# Patient Record
Sex: Male | Born: 1995 | Race: Black or African American | Hispanic: No | Marital: Single | Smoking: Never smoker
Health system: Southern US, Community
[De-identification: ages and names within clinical notes are randomized; demographics above are authoritative.]

---

## 2014-04-01 ENCOUNTER — Emergency Department (HOSPITAL_COMMUNITY)
Admission: EM | Admit: 2014-04-01 | Discharge: 2014-04-02 | Disposition: A | Discharge status: Home / Self Care | Payer: No Typology Code available for payment source | Attending: Emergency Medicine | Admitting: Emergency Medicine

## 2014-04-01 ENCOUNTER — Emergency Department (HOSPITAL_COMMUNITY): Payer: No Typology Code available for payment source

## 2014-04-01 ENCOUNTER — Encounter (HOSPITAL_COMMUNITY): Payer: Self-pay | Admitting: Emergency Medicine

## 2014-04-01 DIAGNOSIS — S01111A Laceration without foreign body of right eyelid and periocular area, initial encounter: Secondary | ICD-10-CM

## 2014-04-01 DIAGNOSIS — S058X9A Other injuries of unspecified eye and orbit, initial encounter: Secondary | ICD-10-CM | POA: Insufficient documentation

## 2014-04-01 DIAGNOSIS — Y9389 Activity, other specified: Secondary | ICD-10-CM | POA: Insufficient documentation

## 2014-04-01 DIAGNOSIS — S0181XA Laceration without foreign body of other part of head, initial encounter: Secondary | ICD-10-CM

## 2014-04-01 DIAGNOSIS — S0180XA Unspecified open wound of other part of head, initial encounter: Secondary | ICD-10-CM | POA: Insufficient documentation

## 2014-04-01 DIAGNOSIS — S0510XA Contusion of eyeball and orbital tissues, unspecified eye, initial encounter: Secondary | ICD-10-CM | POA: Diagnosis not present

## 2014-04-01 DIAGNOSIS — S0990XA Unspecified injury of head, initial encounter: Secondary | ICD-10-CM | POA: Diagnosis present

## 2014-04-01 DIAGNOSIS — S63509A Unspecified sprain of unspecified wrist, initial encounter: Secondary | ICD-10-CM | POA: Diagnosis not present

## 2014-04-01 DIAGNOSIS — S0511XA Contusion of eyeball and orbital tissues, right eye, initial encounter: Secondary | ICD-10-CM

## 2014-04-01 DIAGNOSIS — Y92410 Unspecified street and highway as the place of occurrence of the external cause: Secondary | ICD-10-CM | POA: Insufficient documentation

## 2014-04-01 DIAGNOSIS — S63502A Unspecified sprain of left wrist, initial encounter: Secondary | ICD-10-CM

## 2014-04-01 MED ORDER — LIDOCAINE-EPINEPHRINE-TETRACAINE (LET) SOLUTION
3.0000 mL | Freq: Once | NASAL | Status: AC
  Administered 2014-04-01: 3 mL via TOPICAL
  Filled 2014-04-01: qty 3

## 2014-04-01 MED ORDER — PROPARACAINE HCL 0.5 % OP SOLN
1.0000 [drp] | Freq: Once | OPHTHALMIC | Status: AC
  Administered 2014-04-02: 1 [drp] via OPHTHALMIC
  Filled 2014-04-01: qty 15

## 2014-04-01 MED ORDER — FLUORESCEIN SODIUM 1 MG OP STRP
1.0000 | ORAL_STRIP | Freq: Once | OPHTHALMIC | Status: DC
  Filled 2014-04-01: qty 1

## 2014-04-01 MED ORDER — LIDOCAINE-EPINEPHRINE 1 %-1:100000 IJ SOLN
1.7000 mL | Freq: Once | INTRAMUSCULAR | Status: AC
  Administered 2014-04-02: 1.7 mL via INTRADERMAL
  Filled 2014-04-01: qty 1

## 2014-04-01 MED ORDER — OXYCODONE-ACETAMINOPHEN 5-325 MG PO TABS
2.0000 | ORAL_TABLET | Freq: Once | ORAL | Status: AC
  Administered 2014-04-01: 2 via ORAL
  Filled 2014-04-01: qty 2
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
## 2014-04-01 NOTE — ED Notes (Signed)
Pt to xray at this time.

## 2014-04-01 NOTE — ED Notes (Signed)
No visible injuiry to head. Pt positive for LOC. Pt able to see through right eye.

## 2014-04-01 NOTE — ED Notes (Signed)
OD  20/20 OD  20/20 OU  20/20

## 2014-04-02 MED ORDER — TRAMADOL HCL 50 MG PO TABS: 50.0000 mg | ORAL_TABLET | Freq: Four times a day (QID) | ORAL | Status: AC | PRN

## 2014-04-02 NOTE — Discharge Instructions (Signed)
° °  Bicycling, Adult Cyclists  Whether motivated by recreation or transportation, more adults are taking up cycling than ever before. Learning more about cycling greatly increases confidence. And it can be a great aid in learning to share the road more effectively.  If you are using your bicycle in different situations than you previously have, such as switching from occasional short recreational rides to regularly commuting to work, you may want to take a short workshop. Begin by assessing yourself: How confident are you in your cycling skills? What would you like to know more about? Are there particular kinds of cycling you would like to try out? Courses and workshops may focus on learning to race, long distance touring, teaching children to cycle safely, commuting, or bike repairs. With that in mind, adult cyclists may wish to check around their community for bike clubs, classes, rides, and other cycling opportunities. Check with the [employer] of [employer] Bicy[employer] (www.bikeleague.org) for a listing of instructional opportunities available in your area.  Brush up on riding skills and rules if it has been a while since you cycled regularly.  Adult cyclists who wish to cycle with small children, and cyclists needing to transport cargo, should investigate the various child seats and trailers available. Determine which are the safest and which will work best for you.  Adult cyclists should learn more about off-road cycling, touring, and racing before participating in these activities. Adult cyclists are encouraged to try cycling on multi-use paths. Remember to respect others' needs on the trails.  Do not underestimate the importance of wearing a helmet. Accidents can happen anywhere. Cyclists should always wear a helmet.  Adult cyclists should learn how to handle harassment from motorists and others in traffic. It is in your best interest not to return any harassment or insults.  Just like a car, a  bicycle requires basic maintenance to keep running smoothly and safely. Bikes are easy to work on and you can save money by learning bike maintenance. This can be done by picking up a manual and taking a repair course. Those who really do not have time should keep their bicycles regularly serviced at a good bike shop.  Bicycling can fit into one's everyday life. Substitute a bike ride for a car trip. Adult cyclists should know the health and environmental benefits of bicycling. Document Released: 10/18/2003 Document Revised: 12/12/2013 Document Reviewed: 07/24/2008 [facility] Patient Information 2015 [place], [place]. This information is not intended to replace advice given to you by your health care provider. Make sure you discuss any questions you have with your health care provider.
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
## 2015-02-21 IMAGING — CR DG WRIST COMPLETE 3+V*L*
4 series · 4 of 4 positions shown · non-contrast
Comparison: None.

CLINICAL DATA: Head injury.  Diffuse wrist pain.

EXAM:
LEFT WRIST - COMPLETE 3+ VIEW

[x wrist pa left]
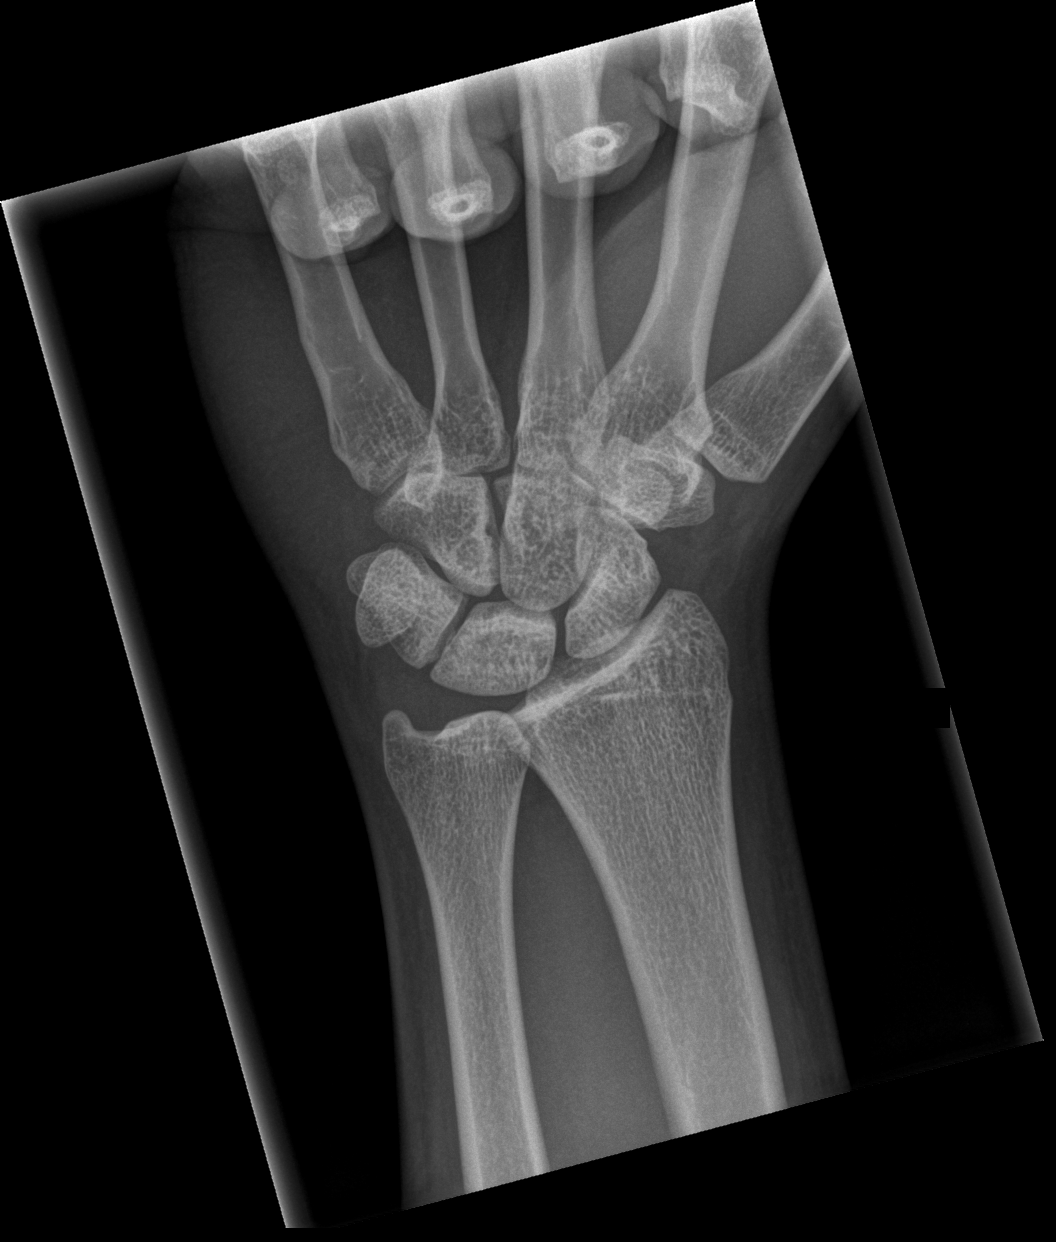

[x wrist obl left]
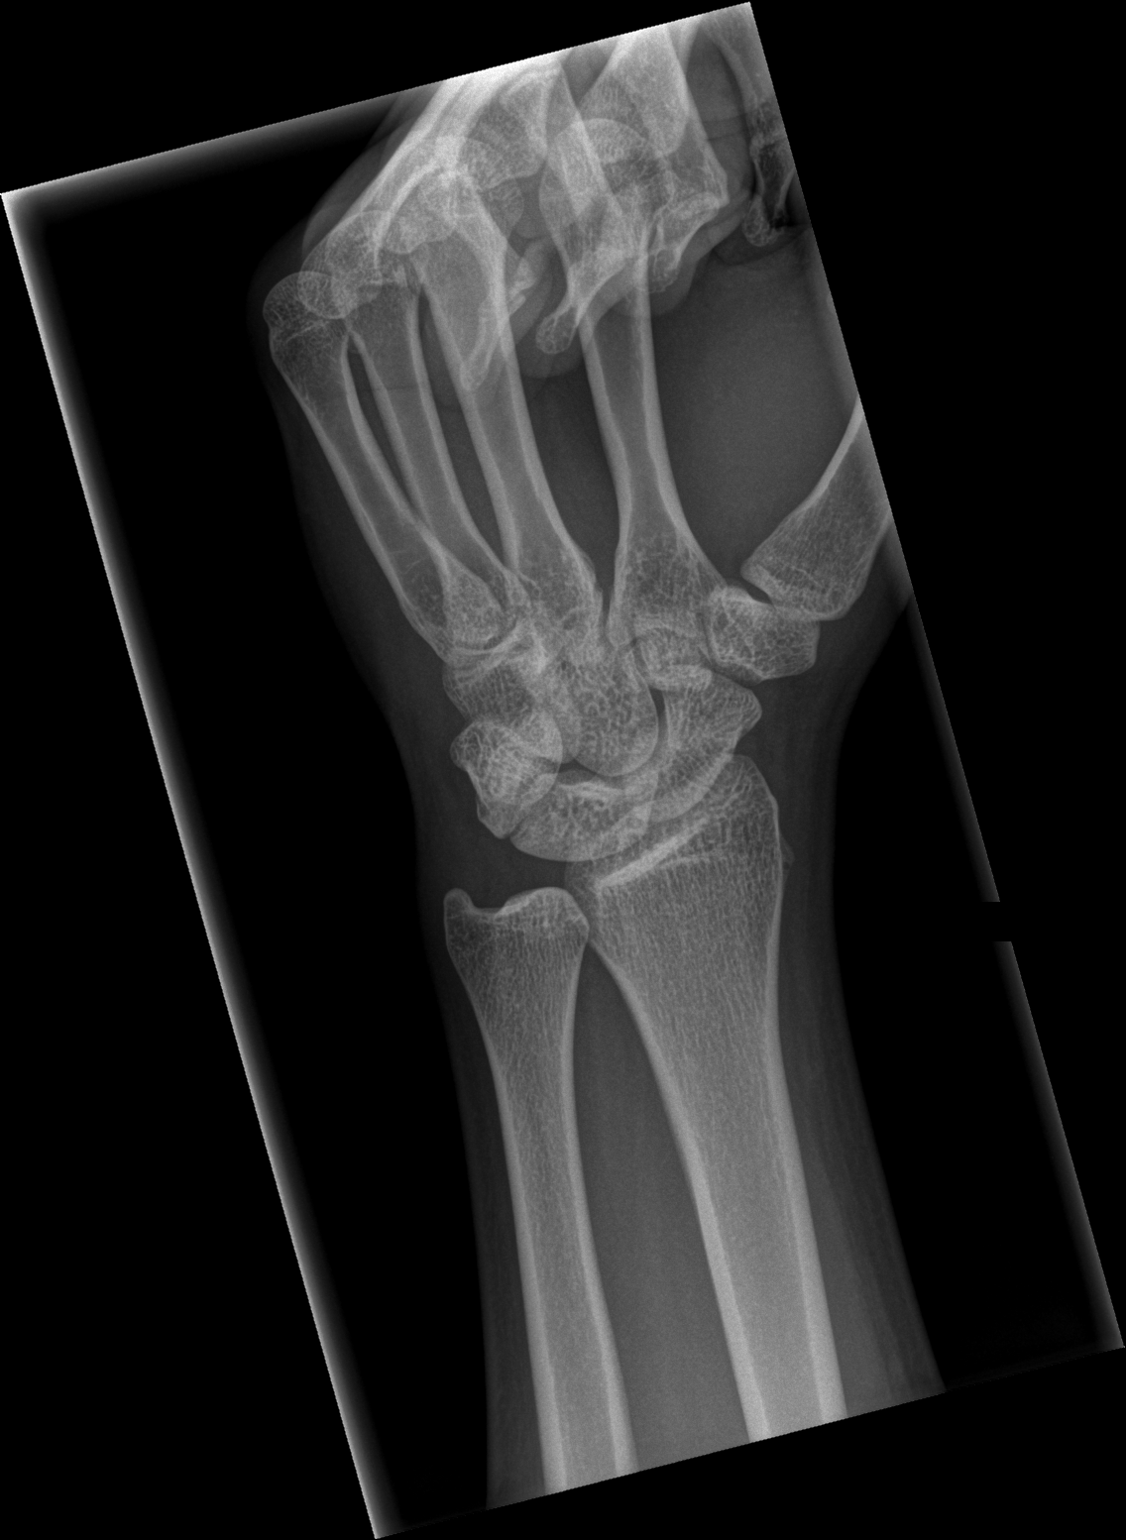

[x wrist lat left]
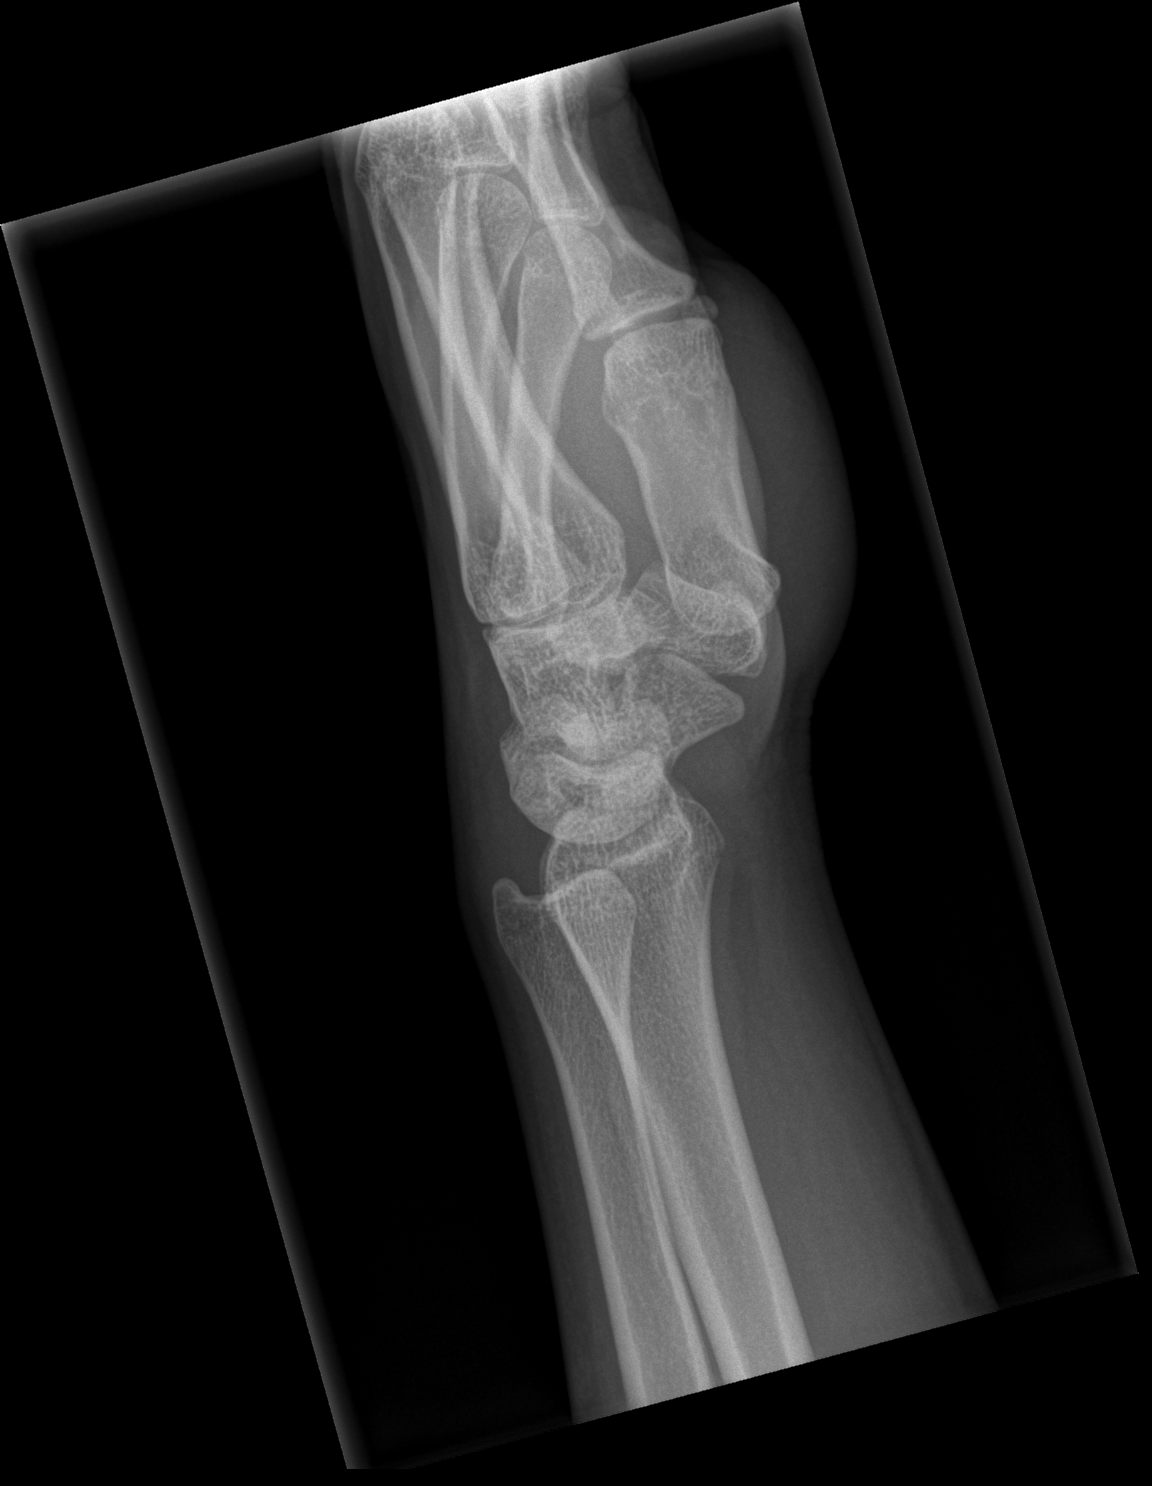

[x wrist navicular view left]
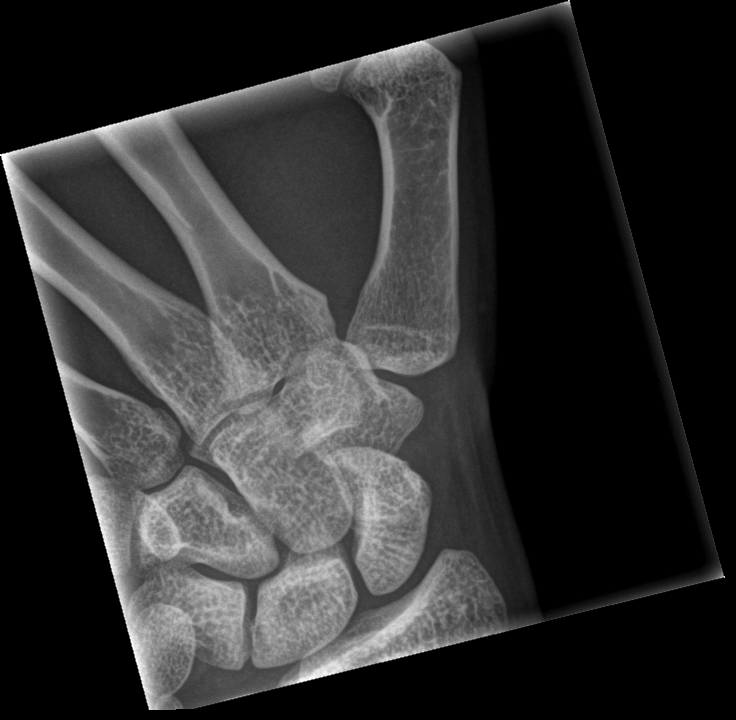

[4 of 4 positions shown; findings below may reference images not displayed]

FINDINGS: There is no evidence of fracture or dislocation. No arthropathy or
joint narrowing.
IMPRESSION: Negative.
